# Patient Record
Sex: Male | Born: 1975 | Race: White | Hispanic: No | Marital: Married | Smoking: Never smoker
Health system: Southern US, Community
[De-identification: ages and names within clinical notes are randomized; demographics above are authoritative.]

## PROBLEM LIST (undated history)

## (undated) HISTORY — PX: PARATHYROIDECTOMY: SHX19

---

## 2011-01-23 ENCOUNTER — Emergency Department: Payer: Self-pay | Admitting: Internal Medicine

## 2015-01-14 ENCOUNTER — Ambulatory Visit: Payer: Self-pay | Admitting: Otolaryngology

## 2015-01-14 LAB — CALCIUM: Calcium, Total: 12.6 mg/dL — ABNORMAL HIGH

## 2015-01-21 ENCOUNTER — Other Ambulatory Visit
Admit: 2015-01-21 | Disposition: A | Discharge status: Home / Self Care | Payer: Self-pay | Attending: Otolaryngology | Admitting: Otolaryngology

## 2015-01-21 LAB — CALCIUM: Calcium, Total: 8.8 mg/dL — ABNORMAL LOW

## 2015-01-21 LAB — ALBUMIN: Albumin: 4 g/dL

## 2015-02-15 LAB — SURGICAL PATHOLOGY

## 2015-02-21 NOTE — Op Note (Signed)
PATIENT NAME:  Logan Ford, Logan Ford MR#:  [id number] DATE OF BIRTH:  [birth year]  DATE OF PROCEDURE:  01/14/2015  PREOPERATIVE DIAGNOSES: Hyperparathyroidism.  POSTOPERATIVE DIAGNOSIS:  Hyperparathyroidism.  PROCEDURE PERFORMED:  Minimally invasive right inferior parathyroidectomy with laryngeal nerve monitoring.   ANESTHESIA: General endotracheal.   ESTIMATED BLOOD LOSS: 10 mL.   INTRAVENOUS FLUIDS: Please see anesthesia record.   COMPLICATIONS: None.   DRAINS AND STENT PLACEMENTS:  SurgiSeal.   SPECIMENS: Right inferior parathyroid adenoma.  SURGEON:  Bud Facereighton Buster Schueller, M.D.   ASSISTANT: Dr. Linus Salmonshapman McQueen.   INDICATIONS FOR PROCEDURE: The patient is a 39 year old gentleman with a history of elevated calciums greater than 13.  Found to have elevated PTH as well on sequential exams.  Patient also had chronic fatigue as well as generalized bone pain and calciums remained elevated.  Due to the patient's age as well as increased calcium levels, patient meets criteria for a symptomatic parathyroidectomy.   OPERATIVE FINDINGS: Deep right inferior parathyroid gland deep and posterior to the trachea was removed. The right recurrent laryngeal nerve was identified and preserved.   DESCRIPTION OF PROCEDURE: After the patient was identified in holding, benefits and risks of the procedure were discussed and consent was reviewed. The patient was taken to the operating room and placed in the supine position. General endotracheal anesthesia was induced with laryngeal nerve monitoring.  A Glide laryngoscope was used to ensure correct positioning. At this time, the patient's neck was prepped and draped in a sterile fashion. Injection of 5 mL of 1% lidocaine with 1:100,000 epinephrine was made in the previously marked skin crease.  A 15 blade scalpel was used to make a horizontal neck incision of approximately 3 cm.  Dissection was made through the subcutaneous tissues and the platysma.  The median raphe was  identified. This was divided.  The right hemithyroid lobe was identified. This was retracted medially and dissection was made along the inferior medial aspect of the thyroid which corresponds to the area on the CT scan and Sestamibi for the parathyroid adenoma. Multiple areas of dissection were made and finely very deep just on top of the prevertebral fascia behind the trachea.  Once the trachea was rotated, the right inferior parathyroid adenoma was identified. This was meticulously dissected away from the surrounding tissues. The recurrent laryngeal nerve was along the deep aspect of the fascia and this was carefully dissected away and then the parathyroid adenoma in its entirety was removed with a combination of bipolar electrocautery as well as Harmonic scalpel. At this time, meticulous hemostasis was ensured and the patient's neck was copiously irrigated with sterile saline. SurgiSeal was placed along the area where the parathyroid adenoma was and then the strap muscles were reapproximated with a single figure-of-eight 4-0 Vicryl, and then the platysma was reapproximated with interrupted 4-0 Vicryl, and skin was closed with Dermabond skin adhesive and topped with a Steri-Strip. At this time, care of the patient was transferred to anesthesia.   ____________________________ Kyung Ruddreighton C. Kennedey Digilio, MD ccv:sp D: 01/14/2015 09:19:36 ET T: 01/14/2015 15:36:48 ET JOB#: [id number]  cc: Kyung Ruddreighton C. Rhylan Kagel, MD, <Dictator> Kyung RuddREIGHTON C Makaiah Terwilliger MD ELECTRONICALLY SIGNED 02/03/2015 17:33

## 2020-06-09 ENCOUNTER — Emergency Department
Admission: EM | Admit: 2020-06-09 | Discharge: 2020-06-09 | Disposition: A | Discharge status: Home / Self Care | Payer: BC Managed Care – PPO | Attending: Emergency Medicine | Admitting: Emergency Medicine

## 2020-06-09 ENCOUNTER — Emergency Department: Payer: BC Managed Care – PPO

## 2020-06-09 ENCOUNTER — Other Ambulatory Visit: Payer: Self-pay

## 2020-06-09 ENCOUNTER — Encounter: Payer: Self-pay | Admitting: Emergency Medicine

## 2020-06-09 DIAGNOSIS — J322 Chronic ethmoidal sinusitis: Secondary | ICD-10-CM | POA: Insufficient documentation

## 2020-06-09 DIAGNOSIS — R519 Headache, unspecified: Secondary | ICD-10-CM | POA: Diagnosis present

## 2020-06-09 LAB — CBC WITH DIFFERENTIAL/PLATELET
Abs Immature Granulocytes: 0.11 10*3/uL — ABNORMAL HIGH (ref 0.00–0.07)
Basophils Absolute: 0.1 10*3/uL (ref 0.0–0.1)
Basophils Relative: 1 %
Eosinophils Absolute: 0.7 10*3/uL — ABNORMAL HIGH (ref 0.0–0.5)
Eosinophils Relative: 7 %
HCT: 40.7 % (ref 39.0–52.0)
Hemoglobin: 13.4 g/dL (ref 13.0–17.0)
Immature Granulocytes: 1 %
Lymphocytes Relative: 30 %
Lymphs Abs: 2.9 10*3/uL (ref 0.7–4.0)
MCH: 27 pg (ref 26.0–34.0)
MCHC: 32.9 g/dL (ref 30.0–36.0)
MCV: 81.9 fL (ref 80.0–100.0)
Monocytes Absolute: 0.8 10*3/uL (ref 0.1–1.0)
Monocytes Relative: 9 %
Neutro Abs: 5.1 10*3/uL (ref 1.7–7.7)
Neutrophils Relative %: 52 %
Platelets: 293 10*3/uL (ref 150–400)
RBC: 4.97 MIL/uL (ref 4.22–5.81)
RDW: 14 % (ref 11.5–15.5)
WBC: 9.7 10*3/uL (ref 4.0–10.5)
nRBC: 0 % (ref 0.0–0.2)

## 2020-06-09 LAB — BASIC METABOLIC PANEL
Anion gap: 11 (ref 5–15)
BUN: <5 mg/dL — ABNORMAL LOW (ref 6–20)
CO2: 26 mmol/L (ref 22–32)
Calcium: 9.2 mg/dL (ref 8.9–10.3)
Chloride: 101 mmol/L (ref 98–111)
Creatinine, Ser: 0.81 mg/dL (ref 0.61–1.24)
GFR calc Af Amer: >60 mL/min (ref >60)
GFR calc non Af Amer: >60 mL/min (ref >60)
Glucose, Bld: 123 mg/dL — ABNORMAL HIGH (ref 70–99)
Potassium: 4 mmol/L (ref 3.5–5.1)
Sodium: 138 mmol/L (ref 135–145)

## 2020-06-09 MED ORDER — AMOXICILLIN-POT CLAVULANATE 875-125 MG PO TABS: 1.0000 | ORAL_TABLET | Freq: Two times a day (BID) | ORAL | 0 refills | Status: AC

## 2020-06-09 MED ORDER — DEXAMETHASONE SODIUM PHOSPHATE 10 MG/ML IJ SOLN
10.0000 mg | Freq: Once | INTRAMUSCULAR | Status: AC
  Administered 2020-06-09: 10 mg via INTRAMUSCULAR
  Filled 2020-06-09: qty 1

## 2020-06-09 MED ORDER — PREDNISONE 20 MG PO TABS: ORAL_TABLET | ORAL | 0 refills | Status: DC

## 2020-06-09 MED ORDER — FLUTICASONE PROPIONATE 50 MCG/ACT NA SUSP: 2.0000 | Freq: Every day | NASAL | 0 refills | Status: AC

## 2020-06-09 MED ORDER — AMOXICILLIN-POT CLAVULANATE 875-125 MG PO TABS
1.0000 | ORAL_TABLET | Freq: Once | ORAL | Status: AC
  Administered 2020-06-09: 1 via ORAL
  Filled 2020-06-09: qty 1

## 2020-06-09 NOTE — Discharge Instructions (Addendum)
Your exam, labs, and CT scan is normal at this time. You have some sinus inflammation without signs of an acute infection. You will be treated with Augmentin, prednisone, and Flonase. Take OTC pseudoephedrine as directed. Follow-up with Dr. Willeen Cass as discussed. Return if needed.

## 2020-06-09 NOTE — ED Notes (Signed)
See triage note  States he has had headache for several weeks  Describes as sinus pressure  No fever  Then developed some numbness to right arm yesterday

## 2020-06-09 NOTE — ED Triage Notes (Signed)
Pt reports over the last few weeks he has had some pain in his head that gets worse when he bends over. Pt reports this am went to [facility] and they told him to come to the ED. Pt reports also has some tingling from his right wrist to his right elbow.

## 2020-06-09 NOTE — ED Provider Notes (Signed)
[facility] Emergency Department Provider Note ____________________________________________  Time seen: 1352  I have reviewed the triage vital signs and the nursing notes.  HISTORY  Chief Complaint  Headache  HPI Logan Ford is a 44 y.o. male presents to the ED for evaluation of a persistent left-sided "sinus" headache. He describes pressure behind the left eye, that is worse with bending over or increased pressure, like sneezing or coughing. He denies fevers, chills, vertigo, tinnitus, syncope, or vision changes. He was evaluated at a local urgent care center last month, but failed to follow-up as directed. He has been taking OTC allergy medicine, DayQuil, NyQuil, and Aleve without much benefit. He denies a history of headaches or a family history of CVA, TIA, or aneurysm.   History reviewed. No pertinent past medical history.  There are no problems to display for this patient.  History reviewed. No pertinent surgical history.  Prior to Admission medications   Medication Sig Start Date End Date Taking? Authorizing Provider  amoxicillin-clavulanate (AUGMENTIN) 875-125 MG tablet Take 1 tablet by mouth 2 (two) times daily for 10 days. 06/10/20 06/20/20  Damieon Armendariz, Charlesetta Ivory, PA-C  fluticasone (FLONASE) 50 MCG/ACT nasal spray Place 2 sprays into both nostrils daily. 06/09/20   Kieth Hartis, Charlesetta Ivory, PA-C  predniSONE (DELTASONE) 20 MG tablet Take 3 tabs daily x 3 days; Take 2 tabs daily x 3 days; Take 1 tab daily x 3 days; Take 0.5 tabs daily x 2 days 06/10/20   Seham Gardenhire, Charlesetta Ivory, PA-C    Allergies Penicillins  No family history on file.  Social History Social History   Tobacco Use   Smoking status: Not on file  Substance Use Topics   Alcohol use: Not on file   Drug use: Not on file    Review of Systems  Constitutional: Negative for fever. Eyes: Negative for visual changes. ENT: Negative for sore throat. Cardiovascular: Negative for chest  pain. Respiratory: Negative for shortness of breath. Gastrointestinal: Negative for abdominal pain, vomiting and diarrhea. Skin: Negative for rash. Neurological: Positive for headaches. Denies focal weakness or numbness. ____________________________________________  PHYSICAL EXAM:  VITAL SIGNS: ED Triage Vitals  Enc Vitals Group     BP 06/09/20 0940 (!) 126/93     Pulse Rate 06/09/20 0940 85     Resp 06/09/20 0940 18     Temp 06/09/20 0940 98.4 F (36.9 C)     Temp Source 06/09/20 0940 Oral     SpO2 06/09/20 0940 97 %     Weight 06/09/20 0934 250 lb (113.4 kg)     Height 06/09/20 0934 6\' 3"  (1.905 m)     Head Circumference --      Peak Flow --      Pain Score 06/09/20 0934 4     Pain Loc --      Pain Edu? --      Excl. in GC? --     Constitutional: Alert and oriented. Well appearing and in no distress. Head: Normocephalic and atraumatic. Eyes: Conjunctivae are normal. PERRL. Normal extraocular movements and fundi bilaterally Ears: Canals clear. TMs intact bilaterally. Nose: No congestion/rhinorrhea/epistaxis. Nasal turbinates are enlarged, erythematous, and dry bilaterally Mouth/Throat: Mucous membranes are moist. Neck: Supple. Normal ROM Hematological/Lymphatic/Immunological: No cervical lymphadenopathy. Cardiovascular: Normal rate, regular rhythm. Normal distal pulses. Respiratory: Normal respiratory effort. No wheezes/rales/rhonchi. Gastrointestinal: Soft and nontender. No distention. Musculoskeletal: Normal composite fist on the right. Nontender with normal range of motion in all extremities.  Neurologic: CN II-XII grossly  intact. Normal gross sensation. Mildly positive right ulnar tinel's. Normal speech and language. No gross focal neurologic deficits are appreciated. Skin:  Skin is warm, dry and intact. No rash noted. ____________________________________________   LABS (pertinent positives/negatives) Labs Reviewed  BASIC METABOLIC PANEL - Abnormal; Notable for the  following components:      Result Value   Glucose, Bld 123 (*)    BUN <5 (*)    All other components within normal limits  CBC WITH DIFFERENTIAL/PLATELET - Abnormal; Notable for the following components:   Eosinophils Absolute 0.7 (*)    Abs Immature Granulocytes 0.11 (*)    All other components within normal limits  ____________________________________________   RADIOLOGY  CT Head w/o CM  Sinuses/Orbits: Mild mucosal thickening.  Orbits are unremarkable.  Other: None.  IMPRESSION: No acute intracranial abnormality. ____________________________________________  PROCEDURES  Decadron 10 mg IM Augmentin 875 mg PO  Procedures ____________________________________________  INITIAL IMPRESSION / ASSESSMENT AND PLAN / ED COURSE  DDX: sinusitis, non-specific headache, SDH/SAH   Patient with ED evaluation of persistent left-sided headache and facial pressure. His labs and exam are reassuring. His CT scan does reveal sinus wall thickening on the left ethmoid and sphenoid sinuses. He will be treated with steroids, Augmentin, and Flonase given his protracted symptom duration. He is advised to follow-up with ENT for continued symptoms. Return precautions have been reviewed. A work note is provided as requested.   Logan Ford was evaluated in Emergency Department on 06/09/2020 for the symptoms described in the history of present illness. He was evaluated in the context of the global COVID-19 pandemic, which necessitated consideration that the patient might be at risk for infection with the SARS-CoV-2 virus that causes COVID-19. Institutional protocols and algorithms that pertain to the evaluation of patients at risk for COVID-19 are in a state of rapid change based on information released by regulatory bodies including the CDC and federal and state organizations. These policies and algorithms were followed during the patient's care in the  ED. ____________________________________________  FINAL CLINICAL IMPRESSION(S) / ED DIAGNOSES  Final diagnoses:  Chronic ethmoidal sinusitis  Sinus headache      Kyler Lerette, Charlesetta Ivory, PA-C 06/09/20 1643    Gilles Chiquito, MD 06/09/20 [phone]

## 2020-08-15 ENCOUNTER — Encounter: Payer: Self-pay | Admitting: Emergency Medicine

## 2020-08-15 ENCOUNTER — Emergency Department: Payer: BC Managed Care – PPO

## 2020-08-15 ENCOUNTER — Emergency Department
Admission: EM | Admit: 2020-08-15 | Discharge: 2020-08-16 | Disposition: A | Discharge status: Home / Self Care | Payer: BC Managed Care – PPO | Attending: Emergency Medicine | Admitting: Emergency Medicine

## 2020-08-15 ENCOUNTER — Other Ambulatory Visit: Payer: Self-pay

## 2020-08-15 DIAGNOSIS — R509 Fever, unspecified: Secondary | ICD-10-CM

## 2020-08-15 DIAGNOSIS — U071 COVID-19: Secondary | ICD-10-CM | POA: Diagnosis not present

## 2020-08-15 DIAGNOSIS — R0789 Other chest pain: Secondary | ICD-10-CM | POA: Diagnosis not present

## 2020-08-15 DIAGNOSIS — J1282 Pneumonia due to coronavirus disease 2019: Secondary | ICD-10-CM

## 2020-08-15 LAB — CBC WITH DIFFERENTIAL/PLATELET
Abs Immature Granulocytes: 0.03 10*3/uL (ref 0.00–0.07)
Basophils Absolute: 0 10*3/uL (ref 0.0–0.1)
Basophils Relative: 0 %
Eosinophils Absolute: 0 10*3/uL (ref 0.0–0.5)
Eosinophils Relative: 0 %
HCT: 40 % (ref 39.0–52.0)
Hemoglobin: 13.6 g/dL (ref 13.0–17.0)
Immature Granulocytes: 1 %
Lymphocytes Relative: 17 %
Lymphs Abs: 0.8 10*3/uL (ref 0.7–4.0)
MCH: 27 pg (ref 26.0–34.0)
MCHC: 34 g/dL (ref 30.0–36.0)
MCV: 79.4 fL — ABNORMAL LOW (ref 80.0–100.0)
Monocytes Absolute: 0.5 10*3/uL (ref 0.1–1.0)
Monocytes Relative: 10 %
Neutro Abs: 3.3 10*3/uL (ref 1.7–7.7)
Neutrophils Relative %: 72 %
Platelets: 159 10*3/uL (ref 150–400)
RBC: 5.04 MIL/uL (ref 4.22–5.81)
RDW: 13.4 % (ref 11.5–15.5)
WBC: 4.6 10*3/uL (ref 4.0–10.5)
nRBC: 0 % (ref 0.0–0.2)

## 2020-08-15 LAB — COMPREHENSIVE METABOLIC PANEL
ALT: 27 U/L (ref 0–44)
AST: 26 U/L (ref 15–41)
Albumin: 4.4 g/dL (ref 3.5–5.0)
Alkaline Phosphatase: 57 U/L (ref 38–126)
Anion gap: 10 (ref 5–15)
BUN: 8 mg/dL (ref 6–20)
CO2: 25 mmol/L (ref 22–32)
Calcium: 8.8 mg/dL — ABNORMAL LOW (ref 8.9–10.3)
Chloride: 96 mmol/L — ABNORMAL LOW (ref 98–111)
Creatinine, Ser: 0.96 mg/dL (ref 0.61–1.24)
GFR, Estimated: >60 mL/min (ref >60)
Glucose, Bld: 153 mg/dL — ABNORMAL HIGH (ref 70–99)
Potassium: 3.6 mmol/L (ref 3.5–5.1)
Sodium: 131 mmol/L — ABNORMAL LOW (ref 135–145)
Total Bilirubin: 0.8 mg/dL (ref 0.3–1.2)
Total Protein: 7.5 g/dL (ref 6.5–8.1)

## 2020-08-15 LAB — TROPONIN I (HIGH SENSITIVITY): Troponin I (High Sensitivity): 12 ng/L (ref <18)

## 2020-08-15 LAB — RESPIRATORY PANEL BY RT PCR (FLU A&B, COVID)
Influenza A by PCR: NEGATIVE
Influenza B by PCR: NEGATIVE
SARS Coronavirus 2 by RT PCR: POSITIVE — AB

## 2020-08-15 LAB — LACTIC ACID, PLASMA: Lactic Acid, Venous: 1 mmol/L (ref 0.5–1.9)

## 2020-08-15 MED ORDER — ACETAMINOPHEN 325 MG PO TABS
650.0000 mg | ORAL_TABLET | Freq: Once | ORAL | Status: AC
  Administered 2020-08-15: 650 mg via ORAL

## 2020-08-15 MED ORDER — ACETAMINOPHEN 325 MG PO TABS
ORAL_TABLET | ORAL | Status: AC
  Filled 2020-08-15: qty 2

## 2020-08-15 NOTE — ED Notes (Signed)
Date and time results received: 08/15/20  (use smartphrase ".now" to insert current time)  Test: Covid Critical Value: positive  Name of Provider Notified: Dr. Dolores Frame  Orders Received? Or Actions Taken?: acknowledged

## 2020-08-15 NOTE — ED Triage Notes (Signed)
Patient states that he started having a cough and sore throat on Monday. Patient states that today he started having chest pain, shortness of breath and fever.

## 2020-08-15 NOTE — ED Notes (Signed)
MD at bedside. 

## 2020-08-15 NOTE — ED Provider Notes (Signed)
[facility] Emergency Department Provider Note   ____________________________________________   First MD Initiated Contact with Patient 08/15/20 2348     (approximate)  I have reviewed the triage vital signs and the nursing notes.   HISTORY  Chief Complaint Shortness of Breath and Fever    HPI Logan Ford is a 43 y.o. male who presents to the ED from home with a chief complaint of fever, malaise, myalgias, cough and sore throat.  Symptoms x7 days.  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  Started with mild symptoms of nasal congestion, now progressed to cough, chest tightness only on coughing, shortness of breath and fever.  Denies abdominal pain, nausea, vomiting or diarrhea.     Past medical history None  There are no problems to display for this patient.   Past Surgical History:  Procedure Laterality Date  . PARATHYROIDECTOMY      Prior to Admission medications   Medication Sig Start Date End Date Taking? Authorizing Provider  albuterol (VENTOLIN HFA) 108 (90 Base) MCG/ACT inhaler Inhale 2 puffs into the lungs every 4 (four) hours as needed for wheezing or shortness of breath. 08/16/20   Irean Hong, MD  fluticasone (FLONASE) 50 MCG/ACT nasal spray Place 2 sprays into both nostrils daily. 06/09/20   Menshew, Charlesetta Ivory, PA-C  predniSONE (DELTASONE) 50 MG tablet 1 tablet daily until finished 08/16/20   Irean Hong, MD    Allergies Penicillins  No family history on file.  Social History Social History   Tobacco Use  . Smoking status: Never Smoker  . Smokeless tobacco: Current User  Substance Use Topics  . Alcohol use: Yes    Comment: occ  . Drug use: Never    Review of Systems  Constitutional: Negative for fever, malaise, myalgias Eyes: No visual changes. ENT: No sore throat. Cardiovascular: Positive for chest tightness only on  coughing. Respiratory: Positive for shortness of breath. Gastrointestinal: No abdominal pain.  No nausea, no vomiting.  No diarrhea.  No constipation. Genitourinary: Negative for dysuria. Musculoskeletal: Negative for back pain. Skin: Negative for rash. Neurological: Negative for headaches, focal weakness or numbness.   ____________________________________________   PHYSICAL EXAM:  VITAL SIGNS: ED Triage Vitals [08/15/20 2159]  Enc Vitals Group     BP 139/81     Pulse Rate (!) 101     Resp 20     Temp (!) 103.2 F (39.6 C)     Temp Source Oral     SpO2 97 %     Weight 240 lb (108.9 kg)     Height 6\' 4"  (1.93 m)     Head Circumference      Peak Flow      Pain Score 7     Pain Loc      Pain Edu?      Excl. in GC?     Constitutional: Alert and oriented. Well appearing and in mild acute distress. Eyes: Conjunctivae are normal. PERRL. EOMI. Head: Atraumatic. Nose: No congestion/rhinnorhea. Mouth/Throat: Mucous membranes are moist.   Neck: No stridor.   Cardiovascular: Normal rate, regular rhythm. Grossly normal heart sounds.  Good peripheral circulation. Respiratory: Normal respiratory effort.  No retractions. Lungs mildly diminished bibasilarly. Gastrointestinal: Soft and nontender. No distention. No abdominal bruits. No CVA tenderness. Musculoskeletal: No lower extremity tenderness nor edema.  No joint effusions. Neurologic:  Normal speech and language. No gross focal neurologic  deficits are appreciated. No gait instability. Skin:  Skin is warm, dry and intact. No rash noted.  No petechiae. Psychiatric: Mood and affect are normal. Speech and behavior are normal.  ____________________________________________   LABS (all labs ordered are listed, but only abnormal results are displayed)  Labs Reviewed  RESPIRATORY PANEL BY RT PCR (FLU A&B, COVID) - Abnormal; Notable for the following components:      Result Value   SARS Coronavirus 2 by RT PCR POSITIVE (*)    All  other components within normal limits  COMPREHENSIVE METABOLIC PANEL - Abnormal; Notable for the following components:   Sodium 131 (*)    Chloride 96 (*)    Glucose, Bld 153 (*)    Calcium 8.8 (*)    All other components within normal limits  CBC WITH DIFFERENTIAL/PLATELET - Abnormal; Notable for the following components:   MCV 79.4 (*)    All other components within normal limits  FIBRIN DERIVATIVES D-DIMER ([facility] ONLY) - Abnormal; Notable for the following components:   Fibrin derivatives D-dimer ([facility]) 714.65 (*)    All other components within normal limits  LACTIC ACID, PLASMA  URINALYSIS, COMPLETE (UACMP) WITH MICROSCOPIC  TROPONIN I (HIGH SENSITIVITY)  TROPONIN I (HIGH SENSITIVITY)   ____________________________________________  EKG  ED ECG REPORT I, Zakaiya Lares J, the attending physician, personally viewed and interpreted this ECG.   Date: 08/16/2020  EKG Time: 2202  Rate: 102  Rhythm: sinus tachycardia  Axis: Normal  Intervals:none  ST&T Change: Nonspecific  ____________________________________________  RADIOLOGY I, Ailee Pates J, personally viewed and evaluated these images (plain radiographs) as part of my medical decision making, as well as reviewing the written report by the radiologist.  ED MD interpretation: COVID-19 pneumonia; CTA demonstrates no PE, multifocal ground-glass airspace opacities consistent with COVID-19 pneumonia  Official radiology report(s): DG Chest 2 View  Result Date: 08/15/2020 CLINICAL DATA:  Fever EXAM: CHEST - 2 VIEW COMPARISON:  None. FINDINGS: The heart size and mediastinal contours are within normal limits. There is prominence of the central pulmonary vasculature. Patchy airspace opacity seen at the left lung base. The visualized skeletal structures are unremarkable. IMPRESSION: Patchy airspace opacity at the left lung base which could be due to atelectasis and/or infectious etiology. Mild pulmonary vascular congestion Electronically  Signed   By: Jonna Clark M.D.   On: 08/15/2020 22:55   CT Angio Chest PE W/Cm &/Or Wo Cm  Result Date: 08/16/2020 CLINICAL DATA:  Cough and sore throughout EXAM: CT ANGIOGRAPHY CHEST WITH CONTRAST TECHNIQUE: Multidetector CT imaging of the chest was performed using the standard protocol during bolus administration of intravenous contrast. Multiplanar CT image reconstructions and MIPs were obtained to evaluate the vascular anatomy. CONTRAST:  OMNIPAQUE IOHEXOL 350 MG/ML SOLN COMPARISON:  None. FINDINGS: Cardiovascular: Slightly suboptimal opacification of the main pulmonary artery. No central or proximal segmental pulmonary embolism. The heart is normal in size. No pericardial effusion or thickening. No evidence right heart strain. There is normal three-vessel brachiocephalic anatomy without proximal stenosis. The thoracic aorta is normal in appearance. Mediastinum/Nodes: No hilar, mediastinal, or axillary adenopathy. Thyroid gland, trachea, and esophagus demonstrate no significant findings. Lungs/Pleura: Multifocal patchy round ground-glass opacities are seen throughout both lungs, predominantly at both lung bases. No pleural effusion or pneumothorax. Upper Abdomen: No acute abnormalities present in the visualized portions of the upper abdomen. Musculoskeletal: No chest wall abnormality. No acute or significant osseous findings. Review of the MIP images confirms the above findings. IMPRESSION: Slightly suboptimal opacification of the main pulmonary  artery, however no central or segmental pulmonary embolism Multifocal ground-glass airspace opacities throughout both lungs, consistent with atypical viral pneumonia. Electronically Signed   By: Jonna ClarkBindu  Avutu M.D.   On: 08/16/2020 02:29    ____________________________________________   PROCEDURES  Procedure(s) performed (including Critical Care):  .1-3 Lead EKG Interpretation Performed by: Irean HongSung, Brodi Kari J, MD Authorized by: Irean HongSung, Mcihael Hinderman J, MD      Interpretation: normal     ECG rate:  99   ECG rate assessment: normal     Rhythm: sinus rhythm     Ectopy: none     Conduction: normal   Comments:     Patient placed on cardiac monitor to evaluate for arrhythmias     ____________________________________________   INITIAL IMPRESSION / ASSESSMENT AND PLAN / ED COURSE  As part of my medical decision making, I reviewed the following data within the electronic MEDICAL RECORD NUMBER Nursing notes reviewed and incorporated, Labs reviewed, EKG interpreted, Old chart reviewed (PCP and minor care visits), Radiograph reviewed and Notes from prior ED visits     44 year old male unvaccinated against COVID-19 presenting with fever, malaise, myalgias, cough, chest pain and shortness of breath. Differential includes, but is not limited to, viral syndrome, bronchitis including COPD exacerbation, pneumonia, reactive airway disease including asthma, CHF including exacerbation with or without pulmonary/interstitial edema, pneumothorax, ACS, thoracic trauma, and pulmonary embolism.  Laboratory results unremarkable other than mild hyponatremia.  Covid+.  Ambulatory trial unremarkable; patient maintained room air saturations 95% without tachypnea.  Discussed with patient and offered IV antibody infusion which patient accepts.  Will also start IV Solu-Medrol.  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  Patient already feels better with Tylenol administered in triage.   Clinical Course as of Aug 17 331  Mon Aug 16, 2020  0119 Repeat troponin negative.  Mildly elevated D-dimer, most likely secondary to COVID-19 pneumonia.  However, given patient has 2 risk factors (long-distance truck driver, [id number] pneumonia), will obtain CTA chest to evaluate for PE.   [JS]  0300 No PE seen on CT chest.  Patient being observed status post IV antibiotic infusion.  Will be  discharged after observation period is over.  Currently resting in no acute distress.  Will discharge home on prednisone and albuterol inhaler to use as needed.  Strict return precautions given.  Patient verbalizes understanding agrees with plan of care.   [JS]  [id number] Patient was discharged in good and stable condition after administration of IV antibiotic infusion.   [JS]    Clinical Course User Index [JS] Irean HongSung, Avrie Kedzierski J, MD     ____________________________________________   FINAL CLINICAL IMPRESSION(S) / ED DIAGNOSES  Final diagnoses:  Fever, unspecified fever cause  Pneumonia due to COVID-19 virus     ED Discharge Orders         Ordered    predniSONE (DELTASONE) 50 MG tablet        08/16/20 0303    albuterol (VENTOLIN HFA) 108 (90 Base) MCG/ACT inhaler  Every 4 hours PRN        08/16/20 0303          *Please note:  Eula FriedCorey Dupuis was evaluated in Emergency Department on 08/16/2020 for the symptoms described in the history of present illness. He was evaluated in the context of the global COVID-19 pandemic, which necessitated consideration that the patient might be at risk for infection with  the SARS-CoV-2 virus that causes COVID-19. Institutional protocols and algorithms that pertain to the evaluation of patients at risk for COVID-19 are in a state of rapid change based on information released by regulatory bodies including the CDC and federal and state organizations. These policies and algorithms were followed during the patient's care in the ED.  Some ED evaluations and interventions may be delayed as a result of limited staffing during and the pandemic.*   Note:  This document was prepared using Dragon voice recognition software and may include unintentional dictation errors.   Irean Hong, MD 08/16/20 [phone]

## 2020-08-15 NOTE — ED Notes (Signed)
Ambulation room air , without oxygen. 95-96%

## 2020-08-16 ENCOUNTER — Emergency Department: Payer: BC Managed Care – PPO

## 2020-08-16 ENCOUNTER — Encounter: Payer: Self-pay | Admitting: Radiology

## 2020-08-16 LAB — FIBRIN DERIVATIVES D-DIMER (ARMC ONLY): Fibrin derivatives D-dimer (ARMC): 714.65 ng/mL (FEU) — ABNORMAL HIGH (ref 0.00–499.00)

## 2020-08-16 LAB — TROPONIN I (HIGH SENSITIVITY): Troponin I (High Sensitivity): 13 ng/L (ref <18)

## 2020-08-16 MED ORDER — FAMOTIDINE IN NACL 20-0.9 MG/50ML-% IV SOLN: 20.0000 mg | Freq: Once | INTRAVENOUS | Status: DC | PRN

## 2020-08-16 MED ORDER — METHYLPREDNISOLONE SODIUM SUCC 125 MG IJ SOLR: 125.0000 mg | Freq: Once | INTRAMUSCULAR | Status: DC | PRN

## 2020-08-16 MED ORDER — SODIUM CHLORIDE 0.9 % IV BOLUS
1000.0000 mL | Freq: Once | INTRAVENOUS | Status: AC
  Administered 2020-08-16: 1000 mL via INTRAVENOUS

## 2020-08-16 MED ORDER — DIPHENHYDRAMINE HCL 50 MG/ML IJ SOLN: 50.0000 mg | Freq: Once | INTRAMUSCULAR | Status: DC | PRN

## 2020-08-16 MED ORDER — ALBUTEROL SULFATE HFA 108 (90 BASE) MCG/ACT IN AERS: 2.0000 | INHALATION_SPRAY | RESPIRATORY_TRACT | 0 refills | Status: AC | PRN

## 2020-08-16 MED ORDER — PREDNISONE 50 MG PO TABS: ORAL_TABLET | ORAL | 0 refills | Status: AC

## 2020-08-16 MED ORDER — ALBUTEROL SULFATE HFA 108 (90 BASE) MCG/ACT IN AERS: 2.0000 | INHALATION_SPRAY | Freq: Once | RESPIRATORY_TRACT | Status: DC | PRN

## 2020-08-16 MED ORDER — ACETAMINOPHEN 325 MG PO TABS
ORAL_TABLET | ORAL | Status: AC
  Administered 2020-08-16: 325 mg via ORAL
  Filled 2020-08-16: qty 1

## 2020-08-16 MED ORDER — EPINEPHRINE 0.3 MG/0.3ML IJ SOAJ: 0.3000 mg | Freq: Once | INTRAMUSCULAR | Status: DC | PRN

## 2020-08-16 MED ORDER — ACETAMINOPHEN 325 MG PO TABS: 325.0000 mg | ORAL_TABLET | Freq: Once | ORAL | Status: AC

## 2020-08-16 MED ORDER — IBUPROFEN 800 MG PO TABS
800.0000 mg | ORAL_TABLET | Freq: Once | ORAL | Status: AC
  Administered 2020-08-16: 800 mg via ORAL
  Filled 2020-08-16: qty 1

## 2020-08-16 MED ORDER — METHYLPREDNISOLONE SODIUM SUCC 125 MG IJ SOLR
125.0000 mg | Freq: Once | INTRAMUSCULAR | Status: AC
  Administered 2020-08-16: 125 mg via INTRAVENOUS
  Filled 2020-08-16: qty 2

## 2020-08-16 MED ORDER — SODIUM CHLORIDE 0.9 % IV SOLN: INTRAVENOUS | Status: DC | PRN

## 2020-08-16 MED ORDER — SODIUM CHLORIDE 0.9 % IV SOLN
1200.0000 mg | Freq: Once | INTRAVENOUS | Status: AC
  Administered 2020-08-16: 1200 mg via INTRAVENOUS
  Filled 2020-08-16: qty 10

## 2020-08-16 MED ORDER — IOHEXOL 350 MG/ML SOLN
100.0000 mL | Freq: Once | INTRAVENOUS | Status: AC | PRN
  Administered 2020-08-16: 100 mL via INTRAVENOUS

## 2020-08-16 NOTE — Progress Notes (Signed)
Pharmacy COVID-19 Monoclonal Antibody Screening  Logan Ford was identified as being not hospitalized with symptoms from Covid-19 on admission but an incidental positive PCR has been documented.  The patient may qualify for the use of monoclonal antibodies (mAB) for COVID-19 viral infection to prevent worsening symptoms stemming from Covid-19 infection.  The patient was identified based on a positive COVID-19 PCR and not requiring the use of supplemental oxygen at this time.  This patient meets the FDA criteria for Emergency Use Authorization of casirivimab/imdevimab or bamlanivimab/etesevimab.  Has a (+) direct SARS-CoV-2 viral test result  Is NOT hospitalized due to COVID-19  Is within 10 days of symptom onset  Has at least one of the high risk factor(s) for progression to severe COVID-19 and/or hospitalization as defined in EUA.  Specific high risk criteria : BMI > 25  Additionally: The patient has not had a positive COVID-19 PCR in the last 90 days.  The patient is unvaccinated against COVID-19.  Since the patient is unvaccinated and meets high risk criteria, the patient is eligible for mAB administration.   This eligibility and indication for treatment was discussed with the patient's physician: Dolores Frame  Plan: Based on the above discussion, it was decided that the patient will receive one dose of the available COVID-19 mAB combination. Pharmacy will coordinate administration timing with patient's nurse. Recommended infusion monitoring parameters communicated to the nursing team.   Erisha Paugh D 08/16/2020  12:09 AM

## 2020-08-16 NOTE — ED Notes (Addendum)
E-signiture pad not functioning for patient to sign for medication consent or discharge consent form. Pt explained casirivimab-imdevimab medication prior to administration by ED Physician and pt given documentation with discharge intructions. Pt AOx4 , denied pain or sob prior to discharge.

## 2020-08-16 NOTE — ED Notes (Signed)
Pt back from x-ray.

## 2020-08-16 NOTE — Discharge Instructions (Signed)
1.  Take steroid (Prednisone 50mg  daily x5 days). 2.  You may use Albuterol inhaler 2 puffs every 4 hours as needed for breathing difficulty. 3.  Return to the ER for worsening symptoms, persistent vomiting, difficulty breathing or other concerns.

## 2022-03-25 IMAGING — CT CT HEAD W/O CM
3 series · 16 of 47 positions shown, 19 images · non-contrast
Comparison: None.

CLINICAL DATA: Headache

EXAM:
CT HEAD WITHOUT CONTRAST
TECHNIQUE: Contiguous axial images were obtained from the base of the skull
through the vertex without intravenous contrast.

[Series 2: head wo · axial · 0.42mm/px · z∈[+382,+512]mm · 10 of 32 slices shown, 13 images]
[im 3/32  brain]
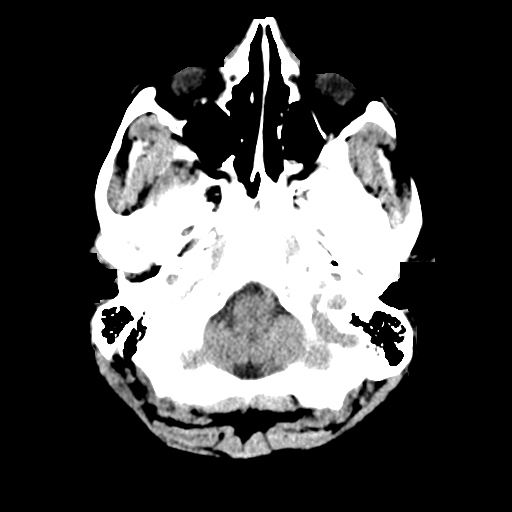
[im 3/32  bone]
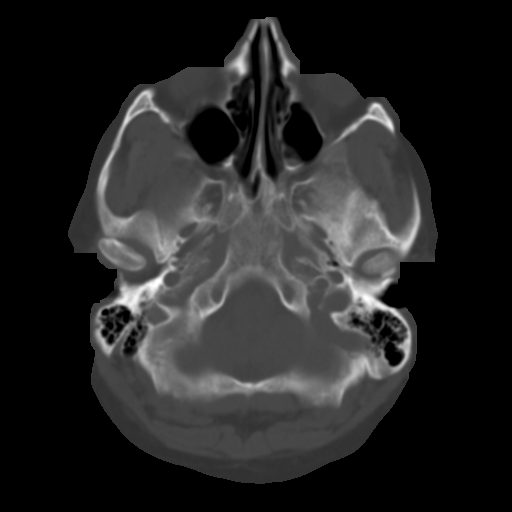
[im 6/32  brain]
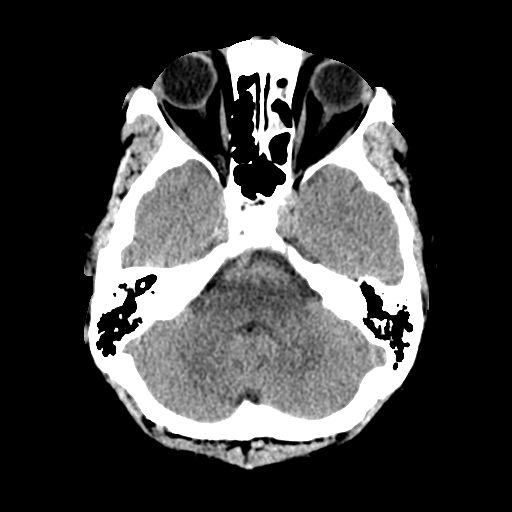
[im 9/32  brain]
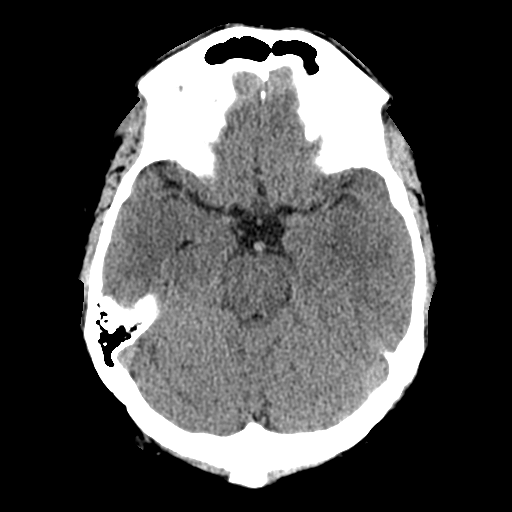
[im 11/32  brain]
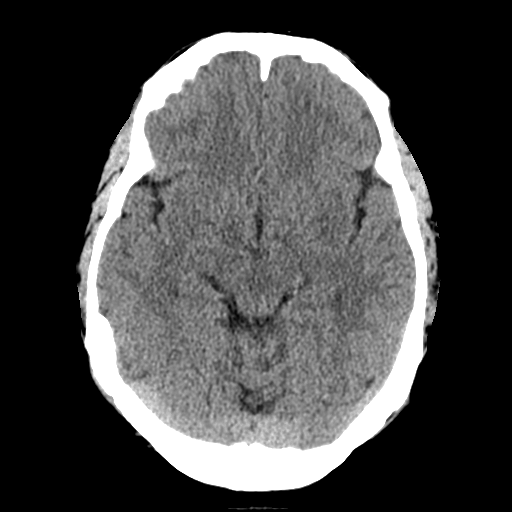
[im 14/32  brain]
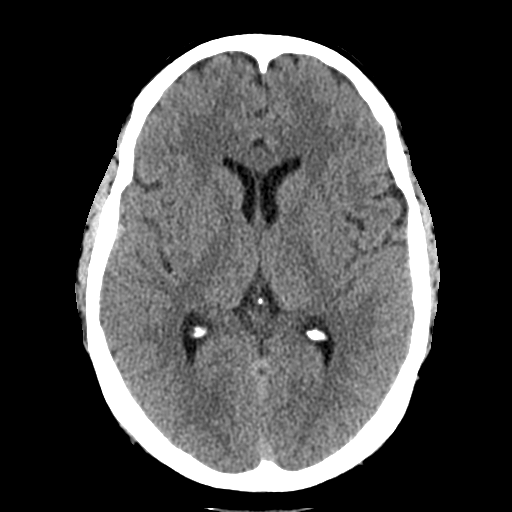
[im 14/32  bone]
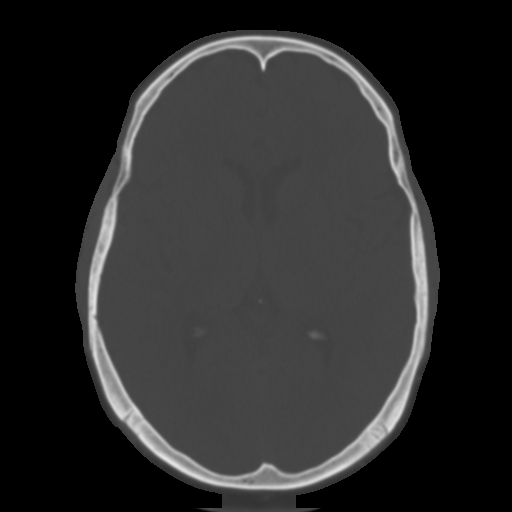
[im 18/32  brain]
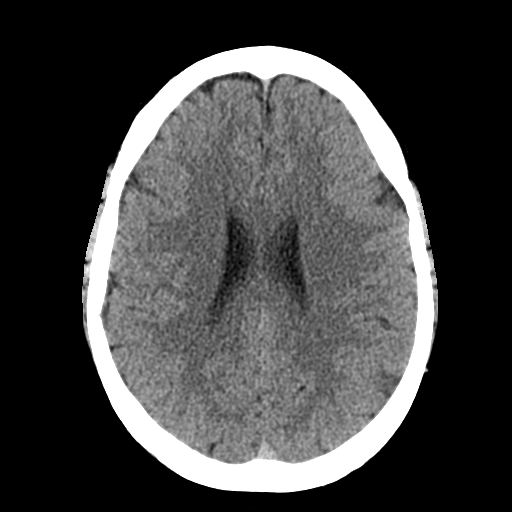
[im 21/32  brain]
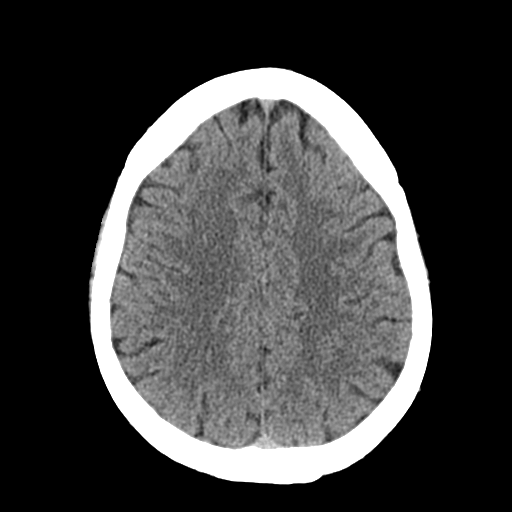
[im 24/32  brain]
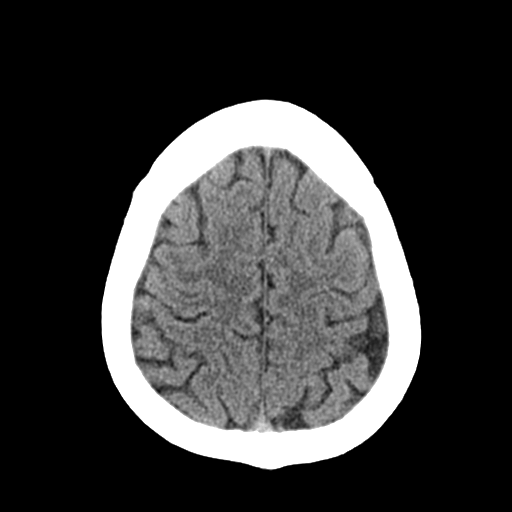
[im 26/32  brain]
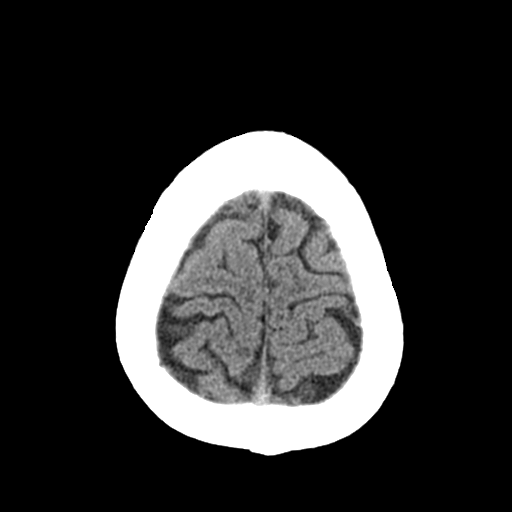
[im 26/32  bone]
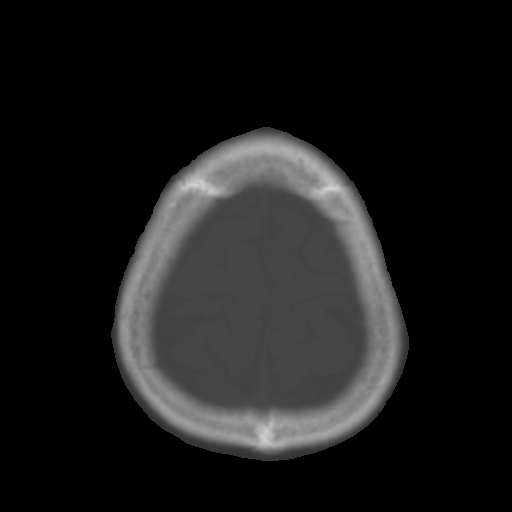
[im 29/32  brain]
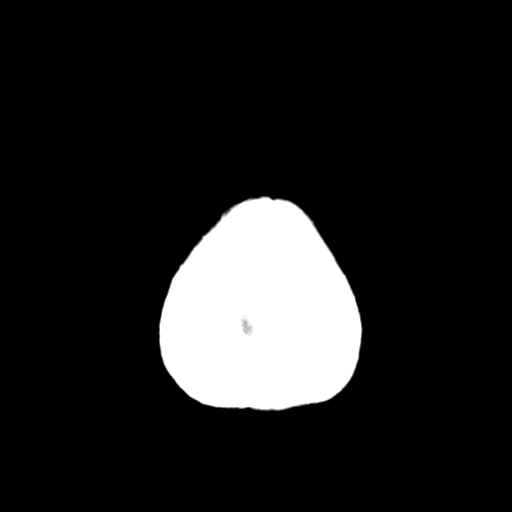

[Series 4: coronal soft tissue · coronal · 0.31mm/px · 3 of 71 slices shown]
[im 24/71  brain]
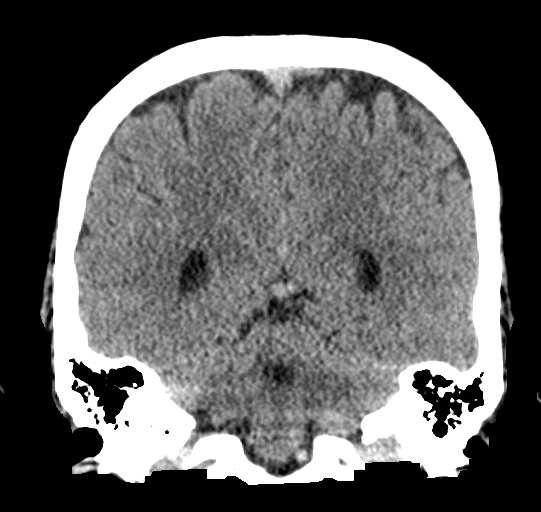
[im 32/71  brain]
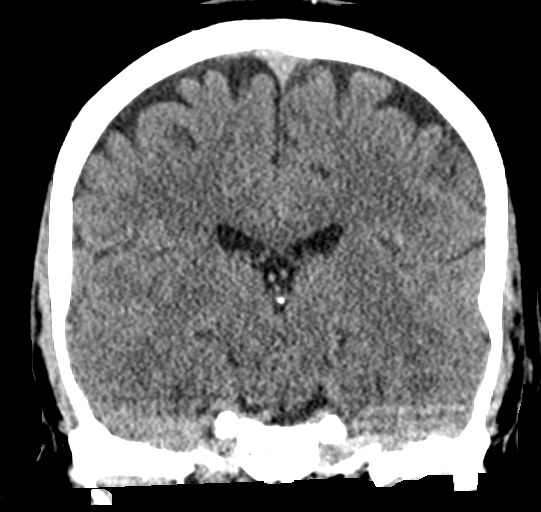
[im 39/71  brain]
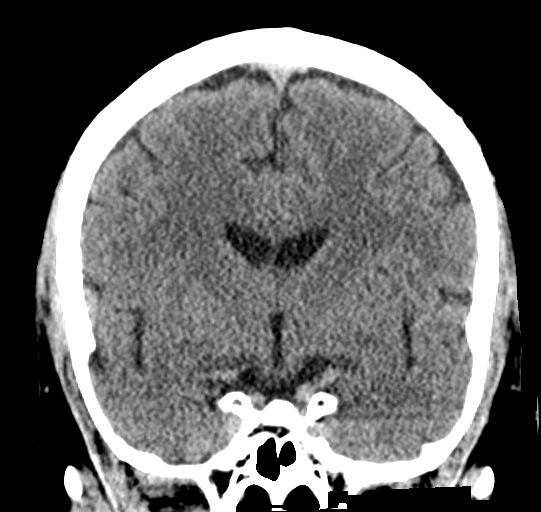

[Series 5: sagittal soft tissue · sagittal · 0.31mm/px · 3 of 64 slices shown]
[im 22/64  brain]
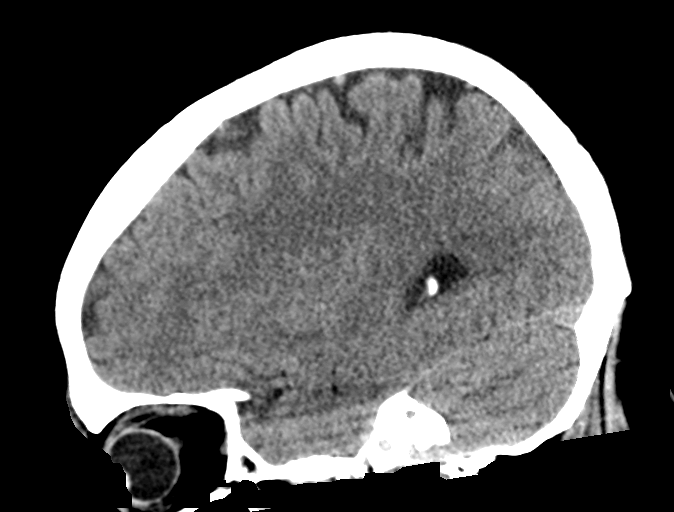
[im 32/64  brain]
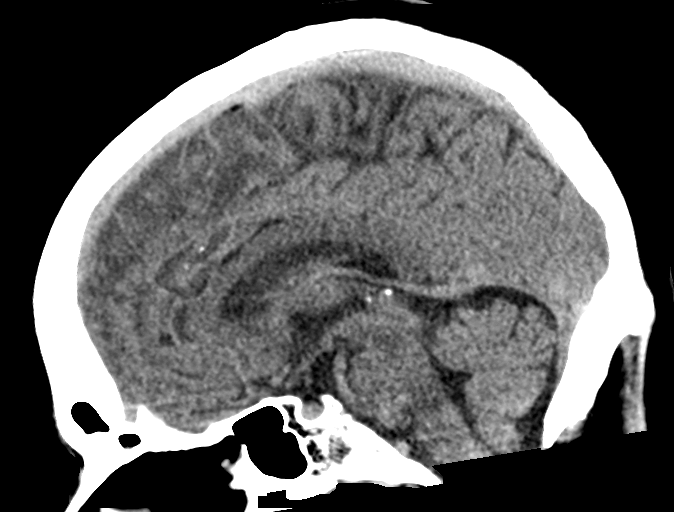
[im 43/64  brain]
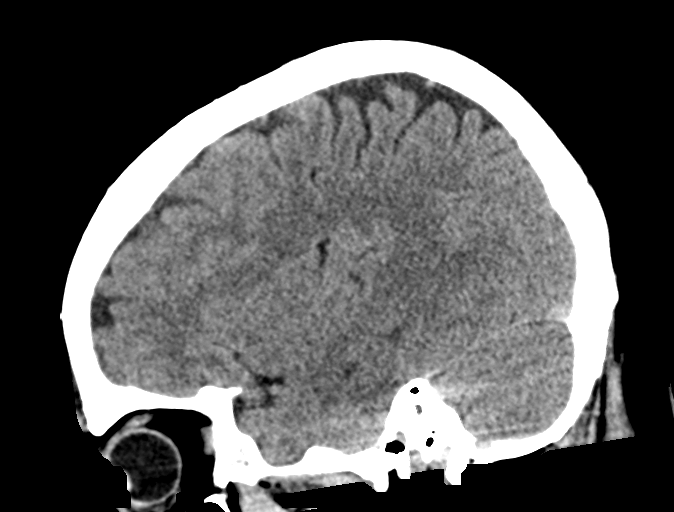

[16 of 47 positions shown; findings below may reference images not displayed]

FINDINGS: Brain: There is no acute intracranial hemorrhage, mass effect, or
edema. Gray-white differentiation is preserved. There is no
extra-axial fluid collection. Ventricles and sulci are within normal
limits in size and configuration.

Vascular: No hyperdense vessel or unexpected calcification.

Skull: Calvarium is unremarkable.

Sinuses/Orbits: Mild mucosal thickening.  Orbits are unremarkable.

Other: None.
IMPRESSION: No acute intracranial abnormality.

## 2022-05-31 IMAGING — CR DG CHEST 2V
1 series · 3 of 3 positions shown · non-contrast
Comparison: None.

CLINICAL DATA: Fever

EXAM:
CHEST - 2 VIEW

[Series 1: dg chest 2 view · 0.14mm/px · 3 of 3 slices shown]
[im 1/3]
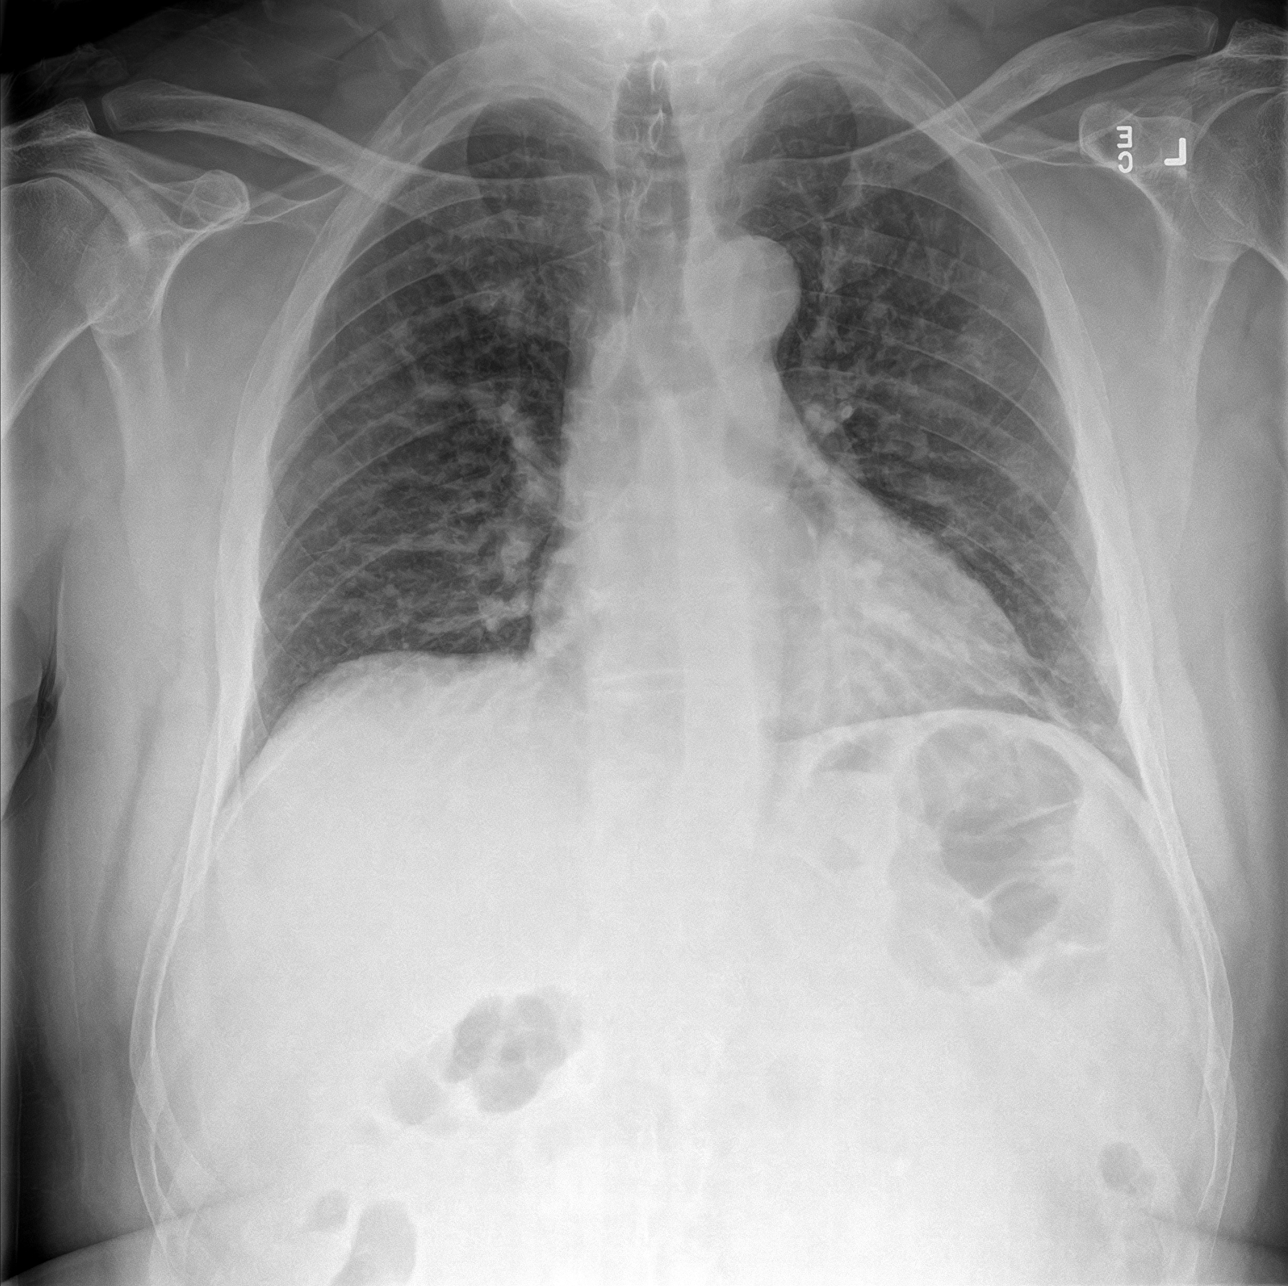
[im 2/3]
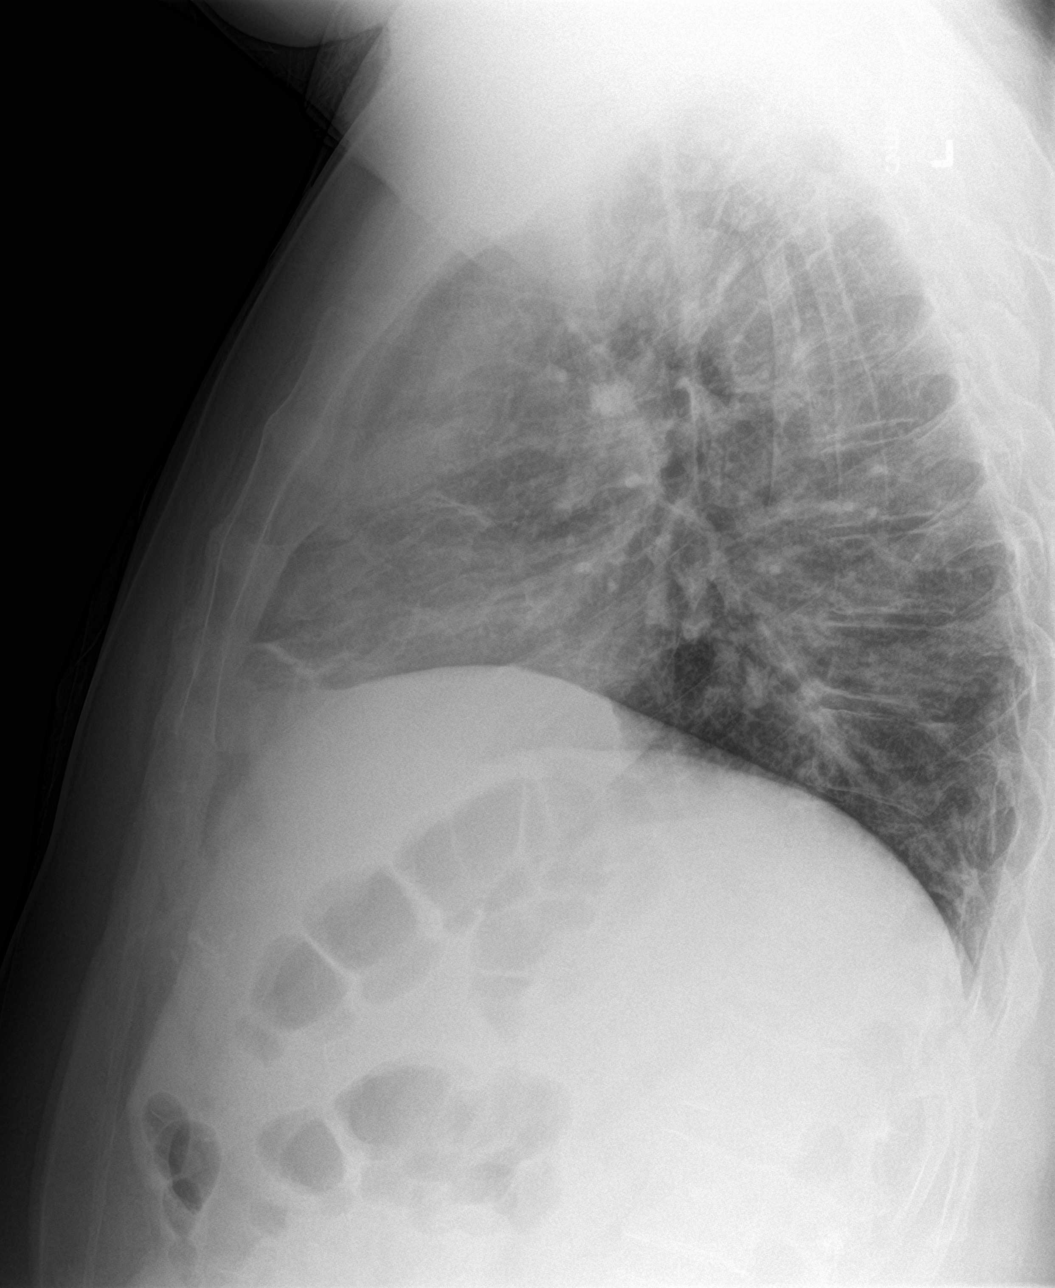
[im 3/3]
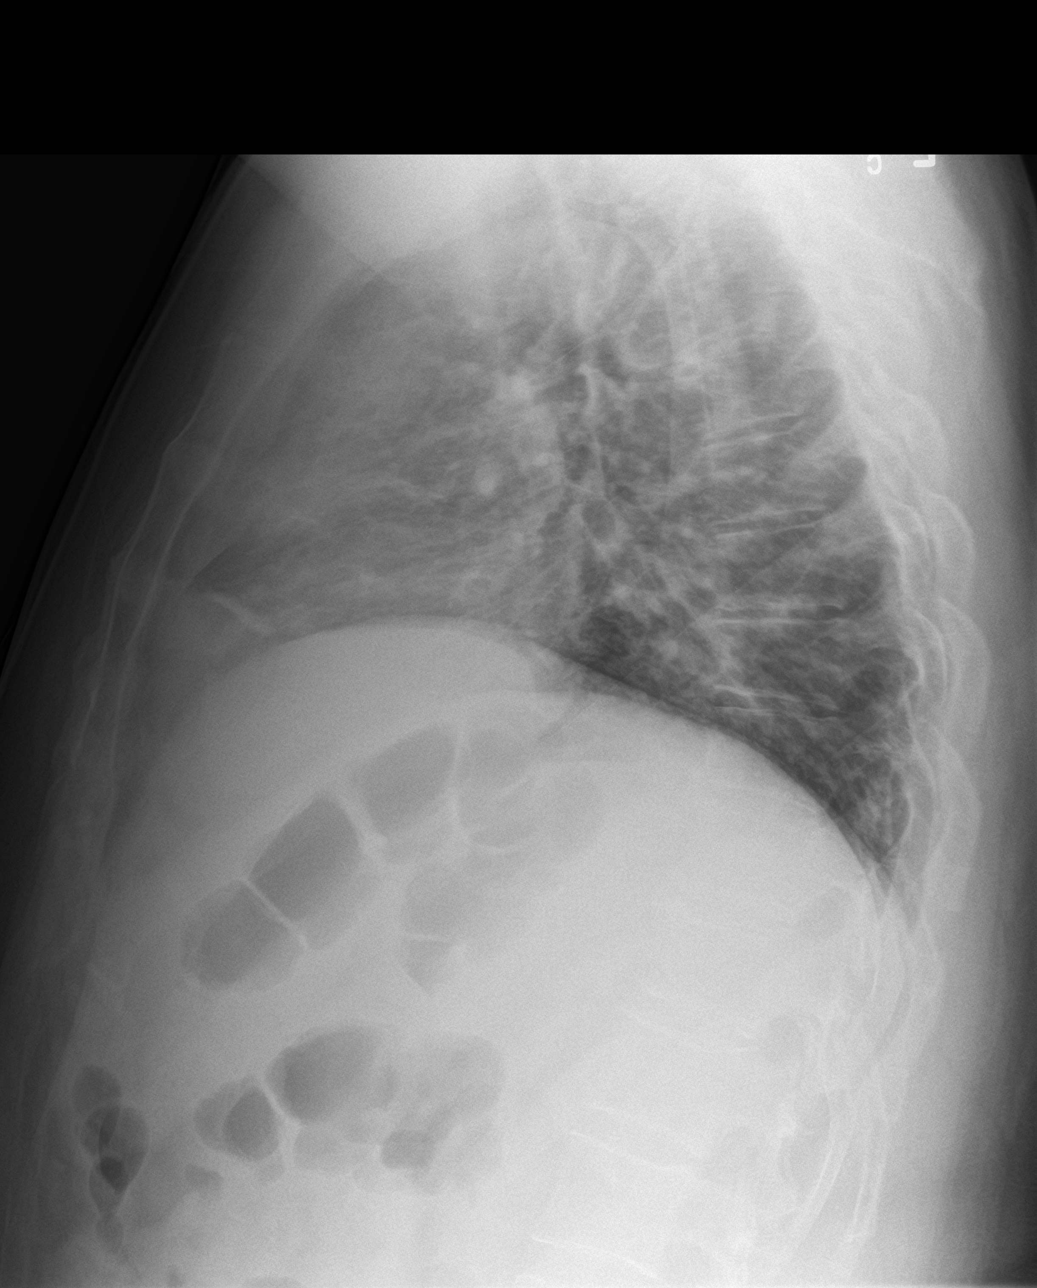

[3 of 3 positions shown; findings below may reference images not displayed]

FINDINGS: The heart size and mediastinal contours are within normal limits.
There is prominence of the central pulmonary vasculature. Patchy
airspace opacity seen at the left lung base. The visualized skeletal
structures are unremarkable.
IMPRESSION: Patchy airspace opacity at the left lung base which could be due to
atelectasis and/or infectious etiology.

Mild pulmonary vascular congestion
